# Patient Record
Sex: Female | Born: 1970
Health system: Western US, Academic
[De-identification: ages and names within clinical notes are randomized; demographics above are authoritative.]

---

## 2018-04-10 ENCOUNTER — Ambulatory Visit: Payer: PRIVATE HEALTH INSURANCE | Attending: Rheumatology

## 2018-04-10 DIAGNOSIS — R51 Headache: Secondary | ICD-10-CM

## 2018-04-10 NOTE — Consults
TITLE:  INITIAL RHEUMATOLOGICAL CONSULTATION    DATE OF SERVICE:  04/10/2018    REFERRING PHYSICIAN:  Dr Janann August      REASON FOR CONSULTATION:  History of temporal arteritis.    HISTORY OF PRESENT ILLNESS:  The patient is a 47 year old Hispanic female who comes in today for evaluation and management of temporal arteritis. She has a reported diagnosis of temporal arteritis, biopsy-proven. In fact, a temporal artery biopsy from March 2019 showed severe chronic arteritis with giant cell formation, focal medial necrosis, intimal proliferation and granulomatous tissue formation with luminal occlusion consistent with advanced temporal arteritis. The patient follows with Dr. Janann August. She has failed Tocilizumab. She is currently on prednisone 30 mg a day, Stelara 90 mg every 8 weeks, sulfasalazine 1500 mg twice a day and methotrexate 50 mg weekly. Plan is to taper down the prednisone.  Of note, patient had a repeat temporal artery ultrasound from September 2019 which was normal. She has a positive ANA at a low titer. She also has a history of seropositive rheumatoid arthritis, which is nonerosive.  With regard to her headaches, she describes skin sensitivity to temporal and parietal areas. She has a history of migraine headaches and fibromyalgia. She is on gabapentin 300 mg twice a day. She also has secondary Sjogren syndrome, previously on biotin treatment.  Patient reports no frank joint swelling or protracted morning stiffness, heel pain or enthesopathy. Patient is unaware of discrete fevers/chills, unexplained weight loss, insect bite with rash or toxin exposure. No paresthesias, dysesthesias, or episodic weakness. Patient specifically denies Raynaud's phenomenon, malar rash, photodermatitis, cutaneous ulcerations, skin psoriasis, nail dystrophy, spontaneous blisters, or nodule formation. Specifically, I cannot elicit a history of anemia, leucopenia, thrombocytopenia, or bleeding dyscrasia. Patient reports no dysphagia, melena, or inflammatory bowel disease. I cannot elicit a history of pleurisy or pleural effusion, pericarditis or pericardial effusion.     PAST MEDICAL HISTORY:  Hx of temporal arteritis, rheumatoid arthritis, Sjogren syndrome, fibromyalgia syndrome, right trochanteric bursitis, heavy menses, iron deficiency anemia, status post gallbladder surgery.    No Known Allergies      REVIEW OF SYSTEMS:  A 14-point review of systems is negative. Refer to above for pertinent positives and to the questionnaire which I reviewed.    Pain Information (Last Filed)     Score Location Comments Edu?      5 Back - x2 Months None None            SOCIAL HISTORY:  Denies history for tobacco or alcohol abuse.    Medications that the patient states to be currently taking   Medication Sig   ??? alendronate 70 mg tablet Take 70 mg by mouth every seven (7) days .   ??? cholecalciferol 1250 mcg (50000 units) CAPS capsule Take by mouth daily .   ??? gabapentin 300 mg capsule two (2) times daily.   ??? HYDROcodone-acetaminophen 5-325 mg tablet Take 1 tablet by mouth every six (6) hours as needed .   ??? naproxen 500 mg tablet Take 500 mg by mouth as needed for .   ??? omeprazole 20 mg DR capsule Take 20 mg by mouth daily .   ??? predniSONE 10 mg tablet Take 30 mg by mouth daily .   ??? RASUVO 15 MG/0.3ML SOAJ Inject under the skin once a week .   ??? STELARA 90 MG/ML injection Inject under the skin Every 12 weeks.Marland Kitchen   ??? traMADol 50 mg tablet Take 50 mg by mouth every six (6) hours as  needed .         FAMILY HISTORY:  Not significant for connective tissue disease or inflammatory arthritis.    PHYSICAL EXAMINATION:  Last Recorded Vital Signs:    04/10/18 0807   BP: 131/88   Pulse: 78   Resp: 14   Temp: 36.4 ???C (97.5 ???F)   SpO2: 98%       General: Appears generally well. No distress.   HENT: Moist mucous membranes. No oral ulcerations.   Eyes: External eye is free of inflammation.   Neck: Supple. No cervical lymphadenopathy. Lungs: Clear to auscultation bilaterally.   Heart: S1 and S2 normal. No murmurs.   GI: Soft. Positive bowel sounds.   Extremities: No edema or cyanosis.   Skin: No skin rashes or nail dystrophy.   Psych: Oriented x3. Normal affect.   Neuro: Grossly nonfocal. Normal gait.   Musculoskeletal:  Temporal arteries normal bilaterally. Pulses appreciated.  Not indurated.   No frank synovitis noted.    ASSESSMENT AND PLAN:  I have discussed the assessment and findings with the patient.      It is highly unlikely for temporal arteritis to occur at age before 74. I would like to review the TAB pathology results. I do agree with taper of glucocorticoids.     I have referred the patient to Neurology for headache management.     She will continue to follow up with her local rheumatologist.     Thank you for allowing me to consult in the care of this patient.      Felipa Evener, MD [phone])        RY/MODL CONF#: [id number]  D: 04/10/2018 09:15:24 T: 04/10/2018 09:55:54 DOCUMENT: [id number]

## 2018-05-31 ENCOUNTER — Telehealth: Payer: PRIVATE HEALTH INSURANCE

## 2018-05-31 NOTE — Telephone Encounter
Dr Satira Mccallum would like to talk to you regarding mutual patient, please call him at [phone]

## 2018-05-31 NOTE — Telephone Encounter
I spoke to Dr Janann August on the phone.    I explained to him the rationale why I highly doubt GCA. Historical data (Age below 40 + resistant to numerous IS including high dose steroids) make GCA less likely.    Rayna Sexton

## 2020-10-01 IMAGING — CR RX OMBRO ESQUERDO
1 series · 2 of 2 positions shown · non-contrast
Comparison: none

[Series 1: esquerda antero-posterior (ap) · 0.14mm/px · 2 of 2 slices shown]
[im 1/2]
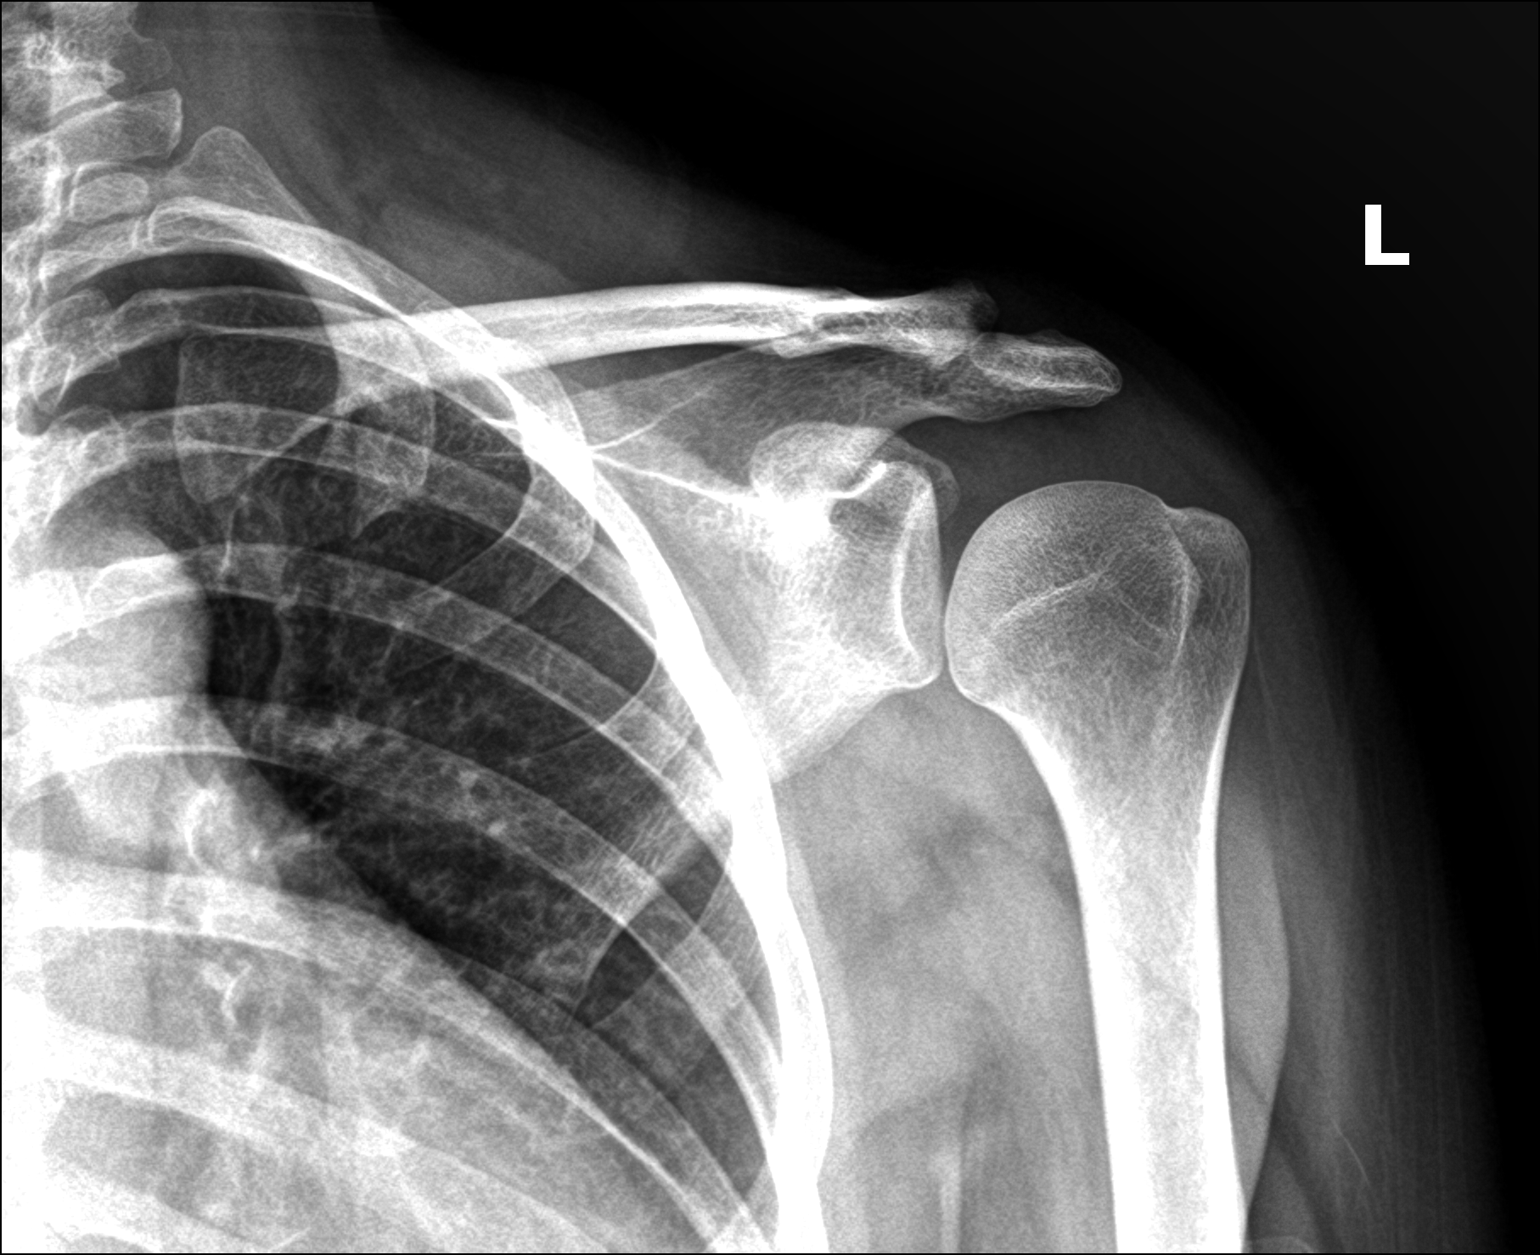
[im 2/2]
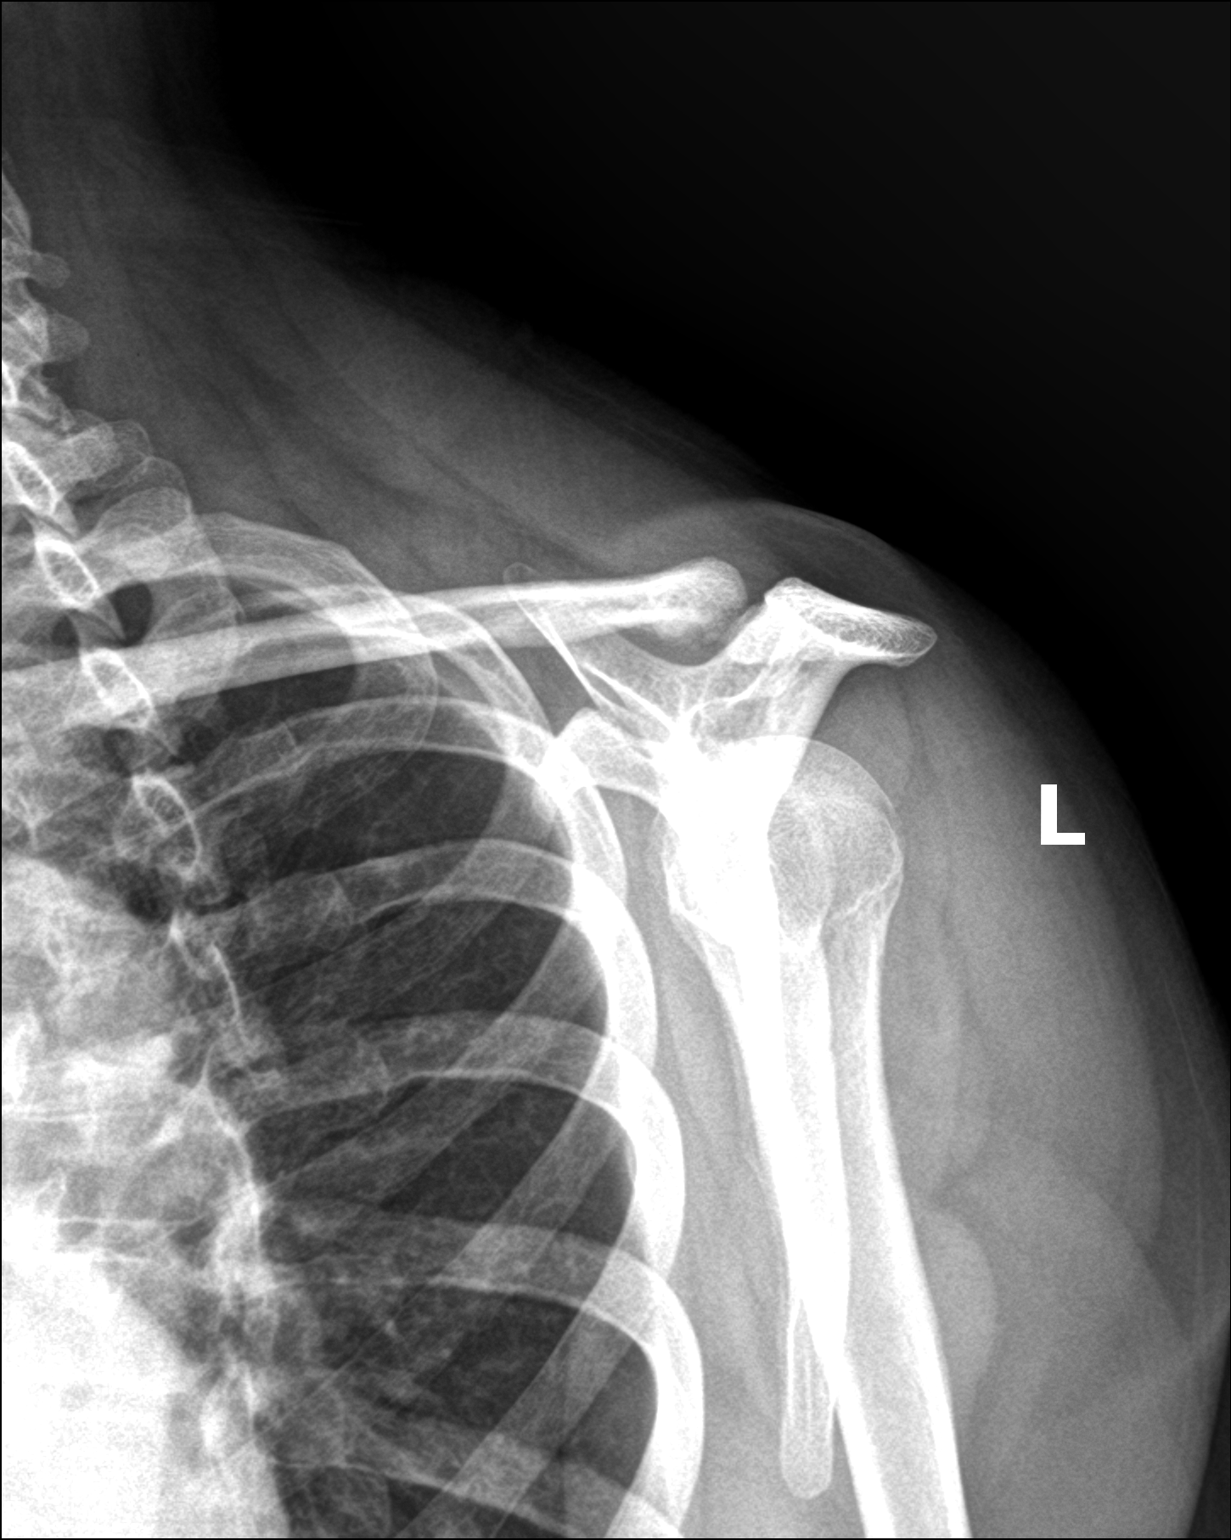

[2 of 2 positions shown; findings below may reference images not displayed]

RADIOGRAFIAS DO OMBRO ESQUERDO

Estruturas ósseas visualizadas íntegras.
Superfícies articulares íntegras, com espaços conservados.
Partes moles sem alterações detectáveis ao método.

## 2022-10-26 IMAGING — CR RX TORAX PA
1 series · 1 of 1 positions shown · non-contrast
Comparison: none

[postero-anterior (pa)]
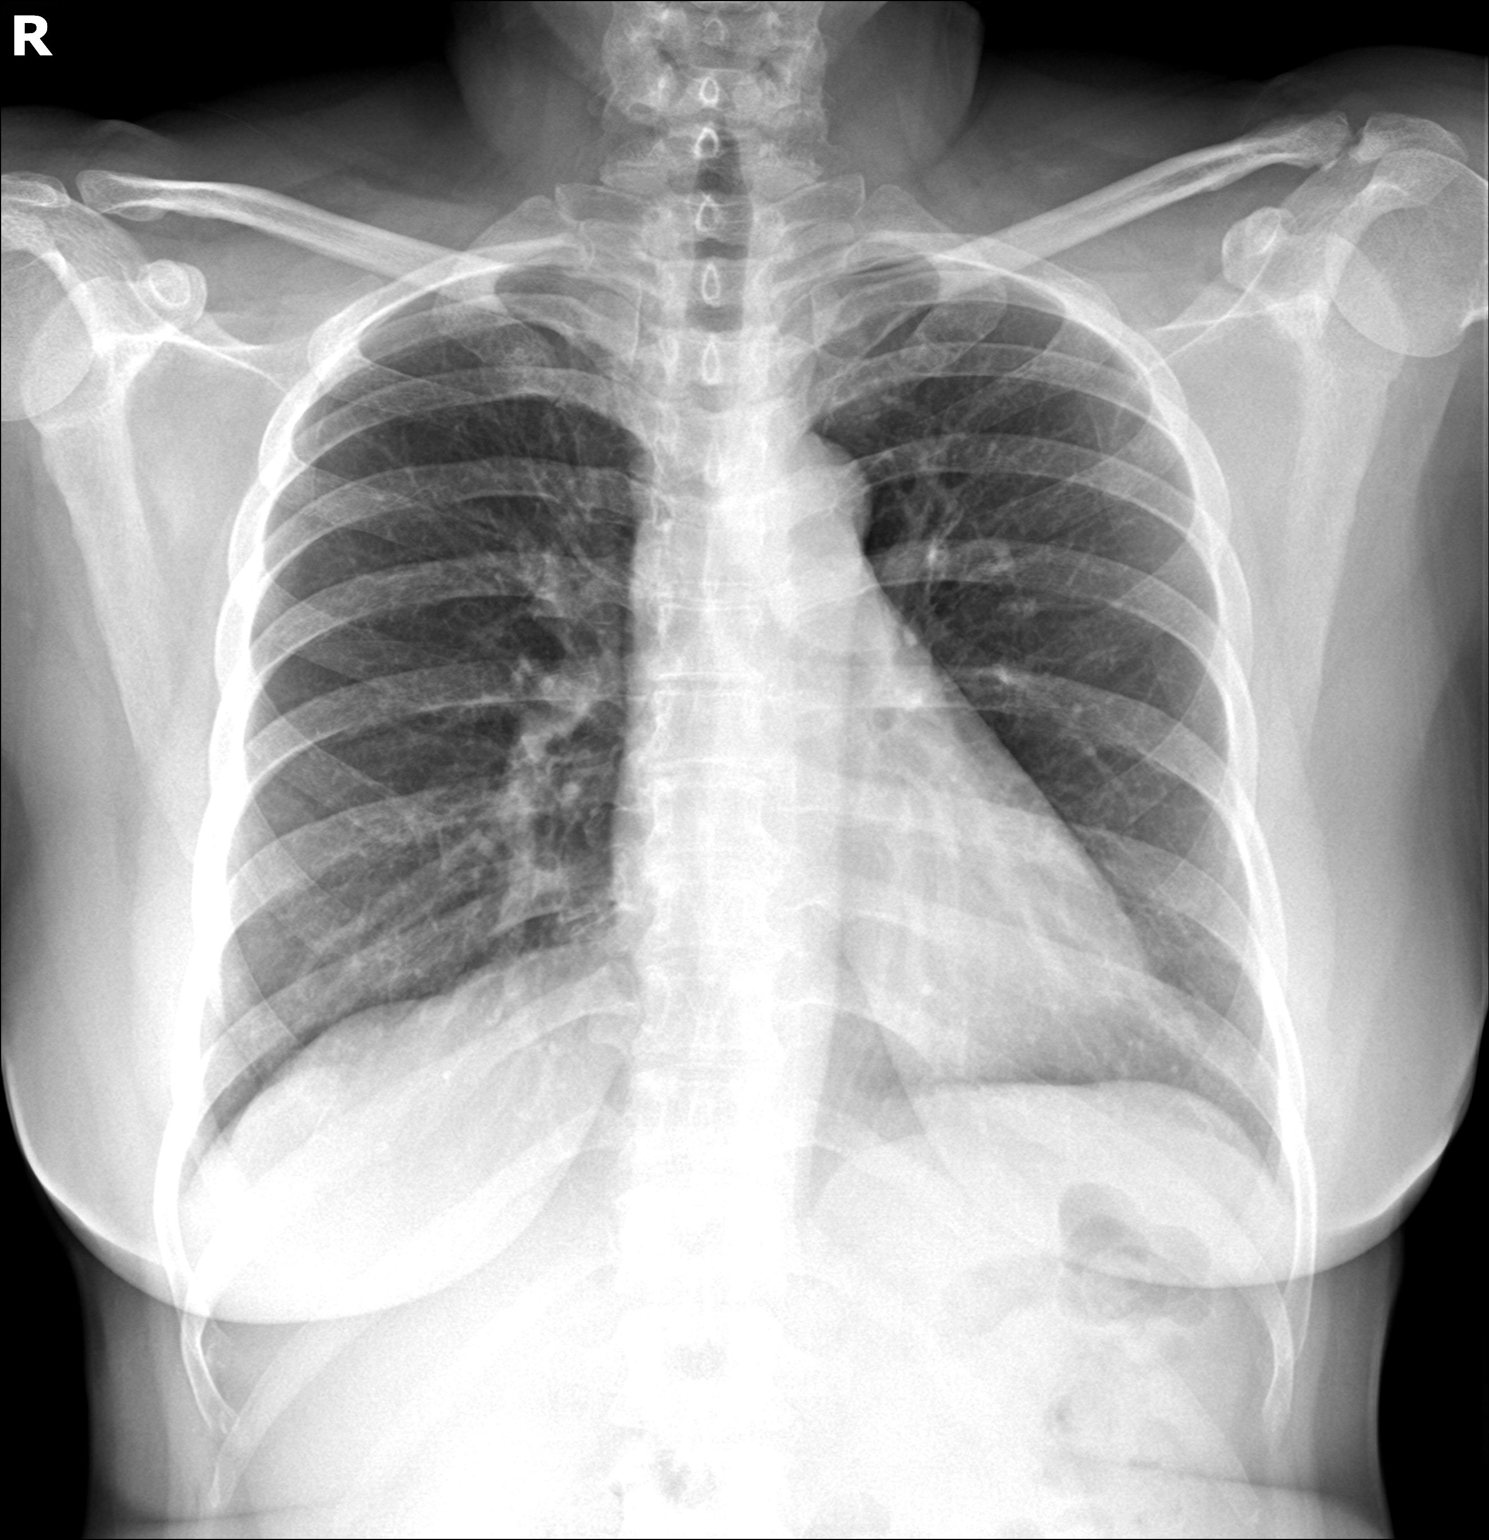

[1 of 1 positions shown; findings below may reference images not displayed]

RADIOGRAFIA DO TÓRAX PA

O parênquima pulmonar apresenta transparência radiográfica usual.
Traqueia e mediastino centrados.
Hilos sem alterações apreciáveis.
Seios costofrênicos visualizados livres.
Índice cardiotorácico normal.

## 2023-05-12 IMAGING — MR COLUNAS cn^COLUNA LOMBAR
4 of 6 series · 35 of 48 positions shown · non-contrast
Comparison: none

[Series 2: T2 · sagittal · 4.0mm · 1.12mm/px · 8 of 14 slices shown (1 of 3)]
[im 1/14]
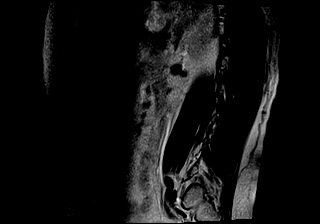
[im 2/14]
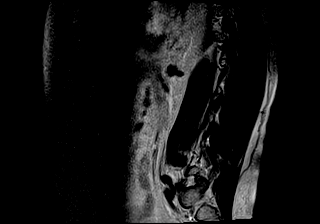
[im 4/14]
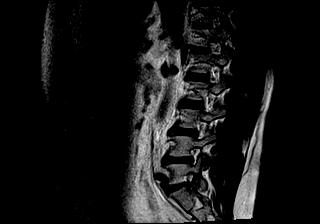
[im 6/14]
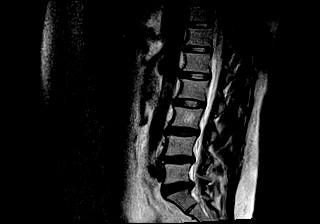
[im 8/14]
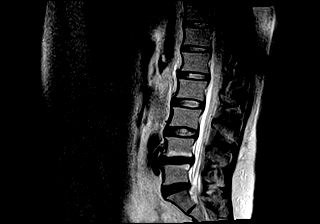
[im 10/14]
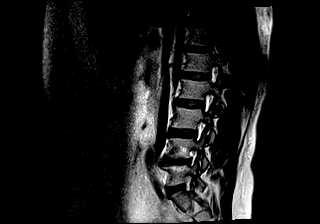
[im 12/14]
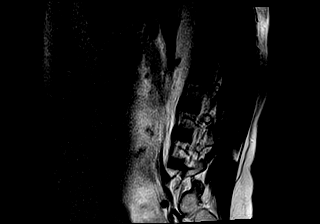
[im 14/14]
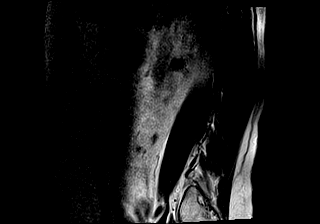

[Series 3: T1 · sagittal · 4.0mm · 1.12mm/px · 7 of 14 slices shown]
[im 1/14]
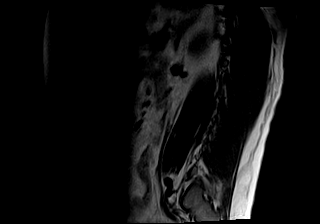
[im 2/14]
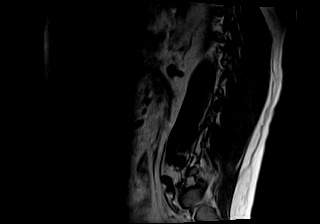
[im 4/14]
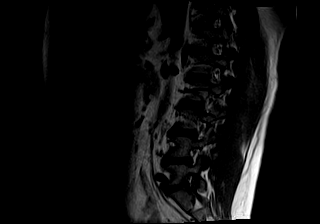
[im 6/14]
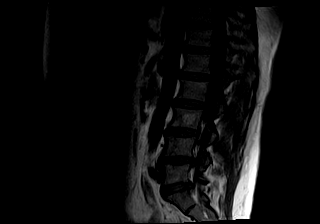
[im 8/14]
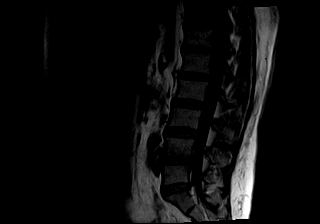
[im 10/14]
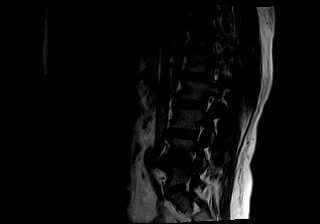
[im 12/14]
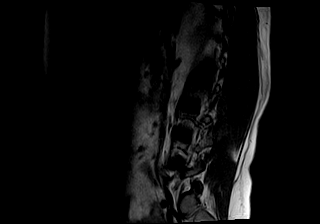

[Series 5: T2 · axial · 4.0mm · 0.55mm/px · z∈[-86,+98]mm · 11 of 20 slices shown (2 of 3)]
[im 1/20]
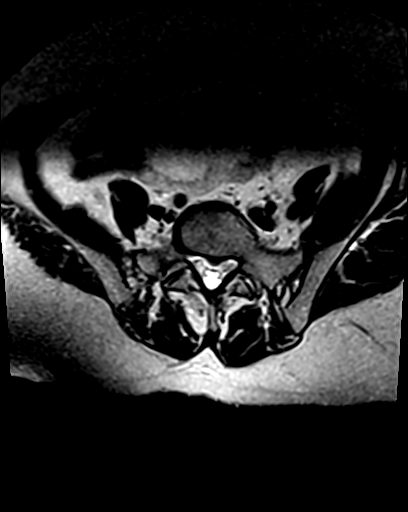
[im 2/20]
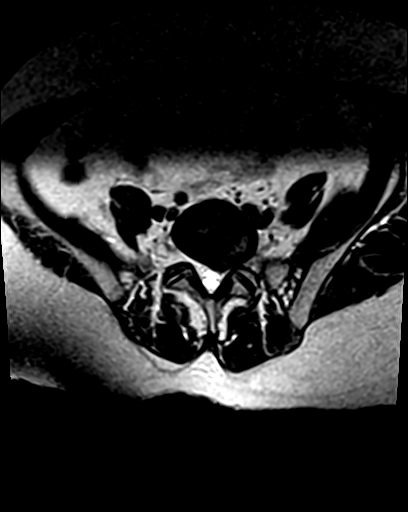
[im 4/20]
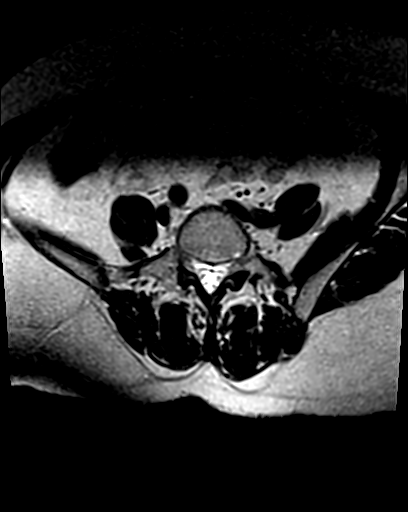
[im 6/20]
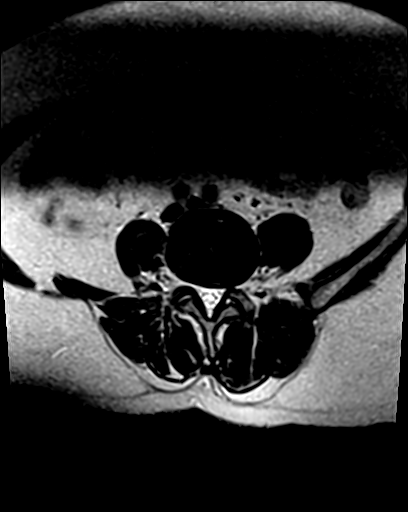
[im 8/20]
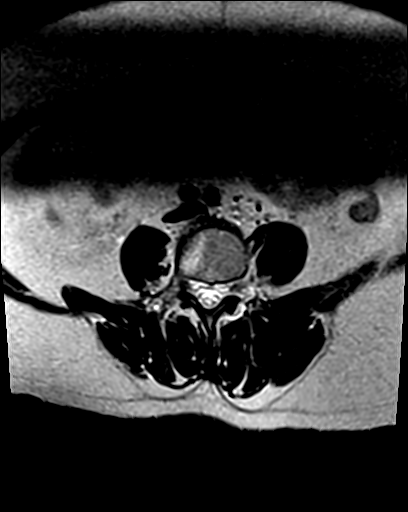
[im 10/20]
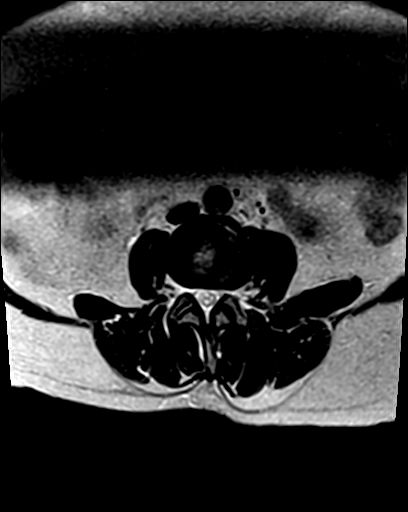
[im 12/20]
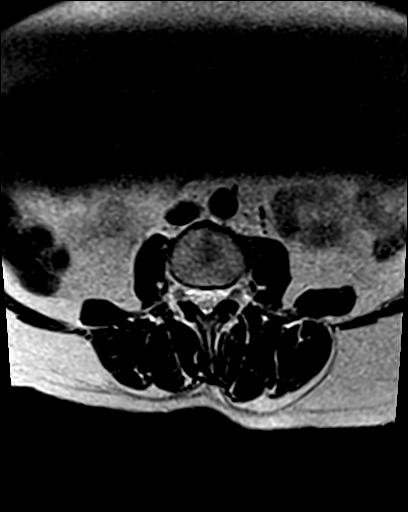
[im 14/20]
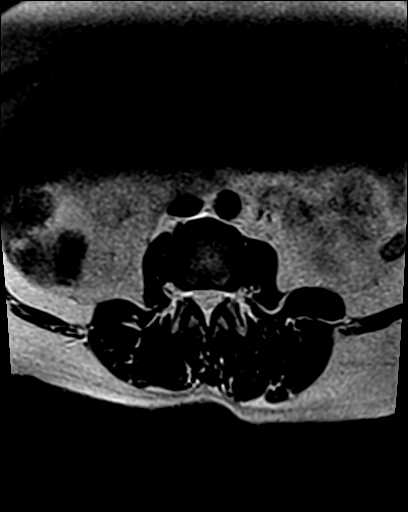
[im 16/20]
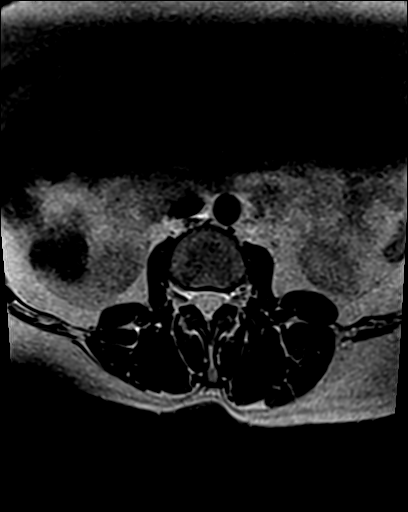
[im 18/20]
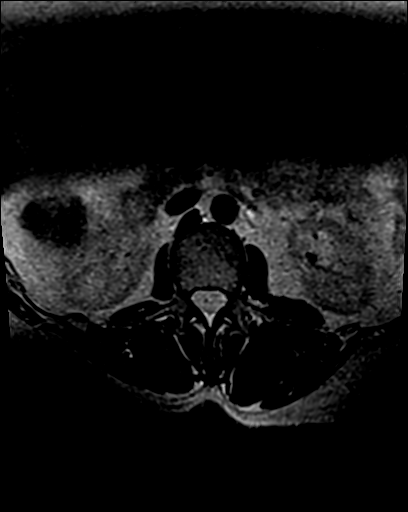
[im 20/20]
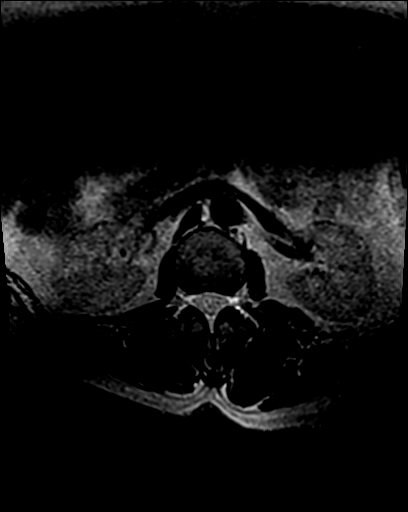

[Series 6: T2 · coronal · 4.0mm · 0.66mm/px · 9 of 16 slices shown (3 of 3)]
[im 1/16]
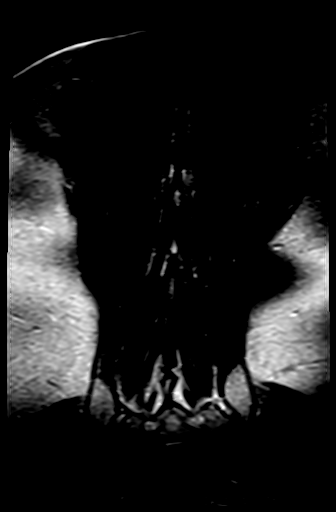
[im 2/16]
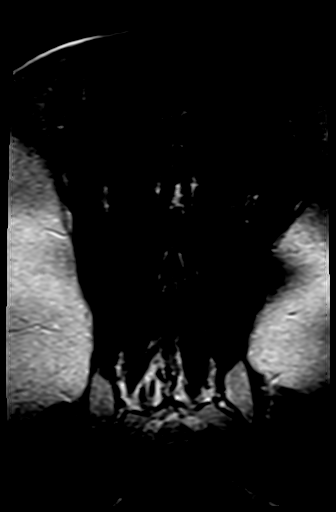
[im 4/16]
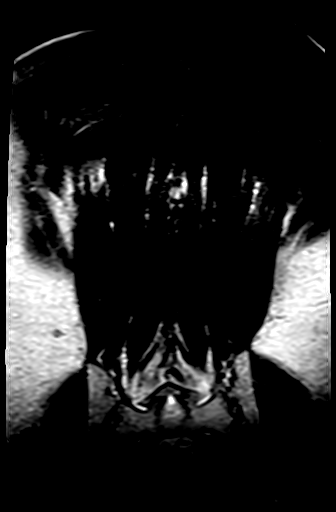
[im 6/16]
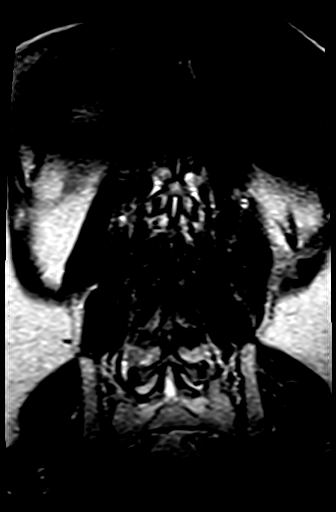
[im 8/16]
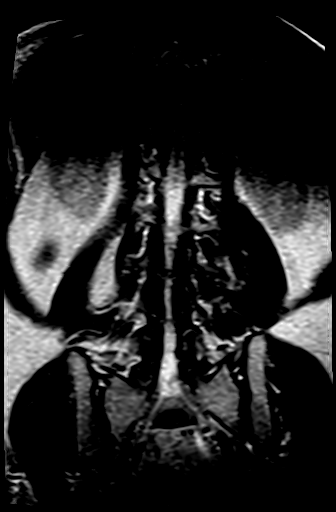
[im 10/16]
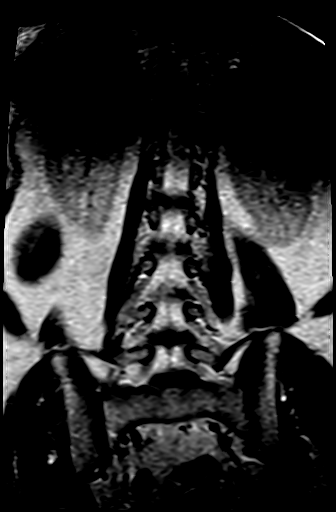
[im 12/16]
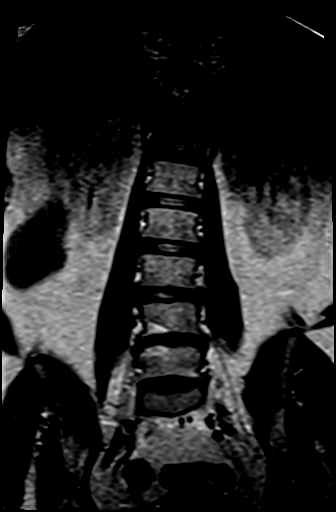
[im 14/16]
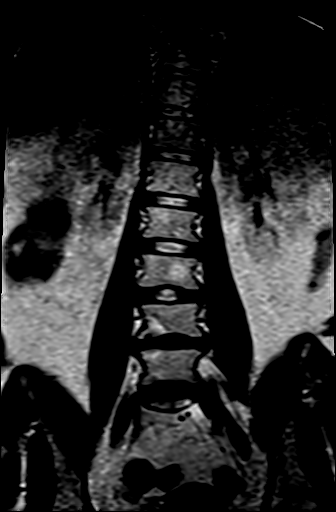
[im 16/16]
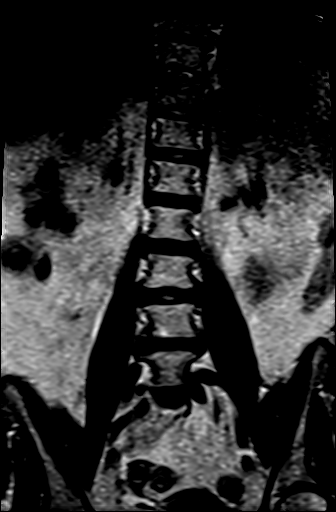

[35 of 48 positions shown; findings below may reference images not displayed]

RESSONÂNCIA MAGNÉTICA DA COLUNA LOMBOSSACRA

TÉCNICA:
Exame realizado em equipamento de ressonância magnética com sequências, ponderações e planos específicos para o segmento de interesse, sem a administração endovenosa do meio de contraste.

RESULTADO:
Bom alinhamento dos corpos vertebrais lombares.
Corpos vertebrais com alturas preservadas.
Osteofitose nos corpos vertebrais lombares.
Pedículos, lâminas, processos transversos e espinhosos preservados.
Articulações interapofisárias preservadas.
Foco de hipersinal em T1 e T2 no corpo vertebral de L3, podendo corresponder a hemangioma ou ainda deposição gordurosa focal.
Alteração degenerativa tipo Modic I (edema) nos planaltos vertebrais apostos de L4-L5.
Sinais de desidratação discal em L4-L5 e L5-S1.

Nível L3-L4: Abaulamento discal simétrico, comprimindo a face ventral do saco dural, sem conflitos radiculares.
Nível L4-L5: Abaulamento discal simétrico, apresentando fissura do ânulo fibroso, comprimindo a face ventral do saco dural, sem conflitos radiculares.
Nível L5-S1: Protrusão discal paramediana esquerda, determinando compressão na face ventral do saco dural, sem conflitos radiculares.

Demais discos intervertebrais sem alterações significativas.
Canal vertebral e forames intervertebrais com amplitude preservada nos demais segmentos.
Espaço liquórico livre nos demais segmentos.
Raízes nervosas foraminais preservadas.
Cone medular com contornos regulares e sinal homogêneo.
Partes moles paravertebrais com aspecto preservado.

CONCLUSÃO:
Espondilose lombar.
Discopatia degenerativa acima descrita.
